# Patient Record
Sex: Female | Born: 1950 | Race: White | Hispanic: No | Marital: Married | Smoking: Never smoker
Health system: Southern US, Community
[De-identification: ages and names within clinical notes are randomized; demographics above are authoritative.]

---

## 2010-08-06 DIAGNOSIS — Z90710 Acquired absence of both cervix and uterus: Secondary | ICD-10-CM | POA: Insufficient documentation

## 2011-06-30 DIAGNOSIS — M1712 Unilateral primary osteoarthritis, left knee: Secondary | ICD-10-CM | POA: Insufficient documentation

## 2014-03-03 DIAGNOSIS — C50911 Malignant neoplasm of unspecified site of right female breast: Secondary | ICD-10-CM | POA: Insufficient documentation

## 2017-10-29 DIAGNOSIS — M545 Low back pain, unspecified: Secondary | ICD-10-CM | POA: Insufficient documentation

## 2019-01-26 DIAGNOSIS — G629 Polyneuropathy, unspecified: Secondary | ICD-10-CM | POA: Insufficient documentation

## 2019-01-26 DIAGNOSIS — K760 Fatty (change of) liver, not elsewhere classified: Secondary | ICD-10-CM | POA: Insufficient documentation

## 2019-01-26 DIAGNOSIS — R7303 Prediabetes: Secondary | ICD-10-CM | POA: Insufficient documentation

## 2019-01-26 DIAGNOSIS — E559 Vitamin D deficiency, unspecified: Secondary | ICD-10-CM | POA: Insufficient documentation

## 2019-08-10 ENCOUNTER — Other Ambulatory Visit: Payer: Self-pay

## 2019-08-10 ENCOUNTER — Ambulatory Visit (INDEPENDENT_AMBULATORY_CARE_PROVIDER_SITE_OTHER): Payer: Medicare Other | Admitting: Family Medicine

## 2019-08-10 ENCOUNTER — Encounter: Payer: Self-pay | Admitting: Family Medicine

## 2019-08-10 ENCOUNTER — Ambulatory Visit (INDEPENDENT_AMBULATORY_CARE_PROVIDER_SITE_OTHER): Payer: Medicare Other

## 2019-08-10 VITALS — BP 158/65 | HR 87 | Temp 97.7°F | Ht 64.5 in | Wt 299.0 lb

## 2019-08-10 DIAGNOSIS — M7520 Bicipital tendinitis, unspecified shoulder: Secondary | ICD-10-CM | POA: Insufficient documentation

## 2019-08-10 DIAGNOSIS — E7800 Pure hypercholesterolemia, unspecified: Secondary | ICD-10-CM | POA: Insufficient documentation

## 2019-08-10 DIAGNOSIS — N951 Menopausal and female climacteric states: Secondary | ICD-10-CM | POA: Insufficient documentation

## 2019-08-10 DIAGNOSIS — I10 Essential (primary) hypertension: Secondary | ICD-10-CM | POA: Insufficient documentation

## 2019-08-10 DIAGNOSIS — K644 Residual hemorrhoidal skin tags: Secondary | ICD-10-CM | POA: Insufficient documentation

## 2019-08-10 DIAGNOSIS — L03115 Cellulitis of right lower limb: Secondary | ICD-10-CM | POA: Diagnosis not present

## 2019-08-10 DIAGNOSIS — G4733 Obstructive sleep apnea (adult) (pediatric): Secondary | ICD-10-CM | POA: Insufficient documentation

## 2019-08-10 DIAGNOSIS — K635 Polyp of colon: Secondary | ICD-10-CM | POA: Insufficient documentation

## 2019-08-10 DIAGNOSIS — Z833 Family history of diabetes mellitus: Secondary | ICD-10-CM | POA: Insufficient documentation

## 2019-08-10 DIAGNOSIS — E538 Deficiency of other specified B group vitamins: Secondary | ICD-10-CM | POA: Insufficient documentation

## 2019-08-10 DIAGNOSIS — E669 Obesity, unspecified: Secondary | ICD-10-CM | POA: Insufficient documentation

## 2019-08-10 DIAGNOSIS — M79671 Pain in right foot: Secondary | ICD-10-CM | POA: Diagnosis not present

## 2019-08-10 DIAGNOSIS — M75101 Unspecified rotator cuff tear or rupture of right shoulder, not specified as traumatic: Secondary | ICD-10-CM | POA: Insufficient documentation

## 2019-08-10 MED ORDER — DOXYCYCLINE HYCLATE 100 MG PO TABS: 100.0000 mg | ORAL_TABLET | Freq: Two times a day (BID) | ORAL | 0 refills | Status: AC

## 2019-08-10 NOTE — Progress Notes (Signed)
Acute Office Visit  Subjective:    Patient ID: Misty Crawford, female    DOB: Mar 04, 1951, 69 y.o.   MRN: [id number]  Chief Complaint  Patient presents with  . Foot Injury    HPI Patient is in today for fell about a week ago and twisted her right ankle.  She said the foot sort of went under her and she had a lot of bruising over the distal foot and toes.  She says it was mostly just tender and painful over the top of the foot when she would try to wear her shoe but not painful with walking.  She has a history of neuropathy and is currently on gabapentin and B complex.. Starting yesterday she noticed ti was red, hot and throbbing.  She has been icing it.      History reviewed. No pertinent past medical history.  History reviewed. No pertinent surgical history.  History reviewed. No pertinent family history.  Social History   Socioeconomic History  . Marital status: Married    Spouse name: Not on file  . Number of children: Not on file  . Years of education: Not on file  . Highest education level: Not on file  Occupational History  . Not on file  Tobacco Use  . Smoking status: Never Smoker  . Smokeless tobacco: Never Used  Substance and Sexual Activity  . Alcohol use: Never  . Drug use: Not on file  . Sexual activity: Not Currently    Partners: Male  Other Topics Concern  . Not on file  Social History Narrative  . Not on file   Social Determinants of Health   Financial Resource Strain:   . Difficulty of Paying Living Expenses: Not on file  Food Insecurity:   . Worried About Charity fundraiser in the Last Year: Not on file  . Ran Out of Food in the Last Year: Not on file  Transportation Needs:   . Lack of Transportation (Medical): Not on file  . Lack of Transportation (Non-Medical): Not on file  Physical Activity:   . Days of Exercise per Week: Not on file  . Minutes of Exercise per Session: Not on file  Stress:   . Feeling of Stress : Not on file  Social  Connections:   . Frequency of Communication with Friends and Family: Not on file  . Frequency of Social Gatherings with Friends and Family: Not on file  . Attends Religious Services: Not on file  . Active Member of Clubs or Organizations: Not on file  . Attends Archivist Meetings: Not on file  . Marital Status: Not on file  Intimate Partner Violence:   . Fear of Current or Ex-Partner: Not on file  . Emotionally Abused: Not on file  . Physically Abused: Not on file  . Sexually Abused: Not on file    Outpatient Medications Prior to Visit  Medication Sig Dispense Refill  . amLODipine (NORVASC) 5 MG tablet Take 5 mg by mouth at bedtime.    Marland Kitchen aspirin 81 MG EC tablet Take 1 tablet by mouth at bedtime.    . B Complex-C-Folic Acid (RENAL) 1 MG CAPS Take 1 capsule by mouth at bedtime.    . Cholecalciferol 50 MCG (2000 UT) CAPS Take 1 capsule by mouth at bedtime.    . DULoxetine (CYMBALTA) 30 MG capsule Take 30 mg by mouth at bedtime.    . furosemide (LASIX) 20 MG tablet Take 20 mg by mouth  2 (two) times daily as needed.    . gabapentin (NEURONTIN) 400 MG capsule Take 400 mg by mouth at bedtime.    Marland Kitchen levocetirizine (XYZAL) 5 MG tablet Take 5 mg by mouth at bedtime.    Marland Kitchen losartan (COZAAR) 100 MG tablet Take 100 mg by mouth daily.    Marland Kitchen lovastatin (MEVACOR) 40 MG tablet Take 40 mg by mouth at bedtime.    . tamoxifen (NOLVADEX) 20 MG tablet Take 20 mg by mouth daily.    . TURMERIC PO Take 1,400 mcg by mouth at bedtime.     No facility-administered medications prior to visit.    Allergies  Allergen Reactions  . Penicillins Anaphylaxis    Age 69: states mother told her she was hospitalized after passing out"    Review of Systems     Objective:    Physical Exam Vitals reviewed.  Constitutional:      Appearance: She is well-developed.  HENT:     Head: Normocephalic and atraumatic.  Eyes:     Conjunctiva/sclera: Conjunctivae normal.  Cardiovascular:     Rate and Rhythm:  Normal rate.  Pulmonary:     Effort: Pulmonary effort is normal.  Musculoskeletal:     Comments: Right foot with 2+ pitting edema in both ankles and feet.  She has a little bit of dried blood at the base of the nail at her left great toenail but no erythema or swelling or active drainage.  On her right foot she has significant erythema over most of the top of the foot it does not extend down to the toes or past the ankle.  She also has significant bruising over the distal metatarsal heads on the top of the foot between the second third fourth and fifth metatarsals.  She is very tender over the proximal and distal end of the fifth metatarsal.  She is able to flex and extend her toes but they are quite stiff and swollen. Right ankle with NROM and no increased laxity  Skin:    General: Skin is dry.     Coloration: Skin is not pale.  Neurological:     Mental Status: She is alert and oriented to person, place, and time.  Psychiatric:        Behavior: Behavior normal.     BP (!) 158/65   Pulse 87   Temp 97.7 F (36.5 C)   Ht 5' 4.5" (1.638 m)   Wt 299 lb (135.6 kg)   BMI 50.53 kg/m  Wt Readings from Last 3 Encounters:  08/10/19 299 lb (135.6 kg)    Health Maintenance Due  Topic Date Due  . Hepatitis C Screening  03/08/1951  . MAMMOGRAM  07/25/1999  . COLONOSCOPY  07/25/1999  . DEXA SCAN  07/24/2014  . TETANUS/TDAP  12/01/2017    There are no preventive care reminders to display for this patient.   No results found for: TSH No results found for: WBC, HGB, HCT, MCV, PLT No results found for: NA, K, CHLORIDE, CO2, GLUCOSE, BUN, CREATININE, BILITOT, ALKPHOS, AST, ALT, PROT, ALBUMIN, CALCIUM, ANIONGAP, EGFR, GFR No results found for: CHOL No results found for: HDL No results found for: LDLCALC No results found for: TRIG No results found for: CHOLHDL No results found for: HGBA1C     Assessment & Plan:   Problem List Items Addressed This Visit    None    Visit Diagnoses     Right foot pain    -  Primary  Relevant Orders   DG Foot Complete Right (Completed)   Cellulitis of right lower extremity       Relevant Medications   doxycycline (VIBRA-TABS) 100 MG tablet     Right foot pain -she has significant tenderness over the fifth metatarsal we will get x-ray just to rule out fracture.  If positive consider a postop shoe versus a cam walker.  She is had a fracture on her opposite foot before in the toes after an injury and said she had really significant difficulty with CAM Walker.  Okay to use Tylenol as needed for pain control.  Cellulitis -ahead and treat with oral doxycycline.  Call if not improving over the next few days.  Did warn that sometimes the redness can spread just a little bit in the first 24 hours after antibiotics but then it should start to proceed and if its not she needs let us know.  Recommend elevation and compression if possible.  Okay to take an extra dose of her furosemide if needed for swelling.   Meds ordered this encounter  Medications  . doxycycline (VIBRA-TABS) 100 MG tablet    Sig: Take 1 tablet (100 mg total) by mouth 2 (two) times daily.    Dispense:  20 tablet    Refill:  0     Beatrice Lecher, MD

## 2019-08-10 NOTE — Patient Instructions (Signed)
Try to keep your feet elevated.  If you have compression stockings that can help with some of the swelling. K to take an extra dose of Lasix if needed.

## 2021-06-07 IMAGING — DX DG FOOT COMPLETE 3+V*R*
4 series · 4 of 4 positions shown · non-contrast
Comparison: None.

CLINICAL DATA: Right foot pain, swelling and bruising after fall
last week rolling foot.

EXAM:
RIGHT FOOT COMPLETE - 3+ VIEW

[foot ap (1 of 2)]
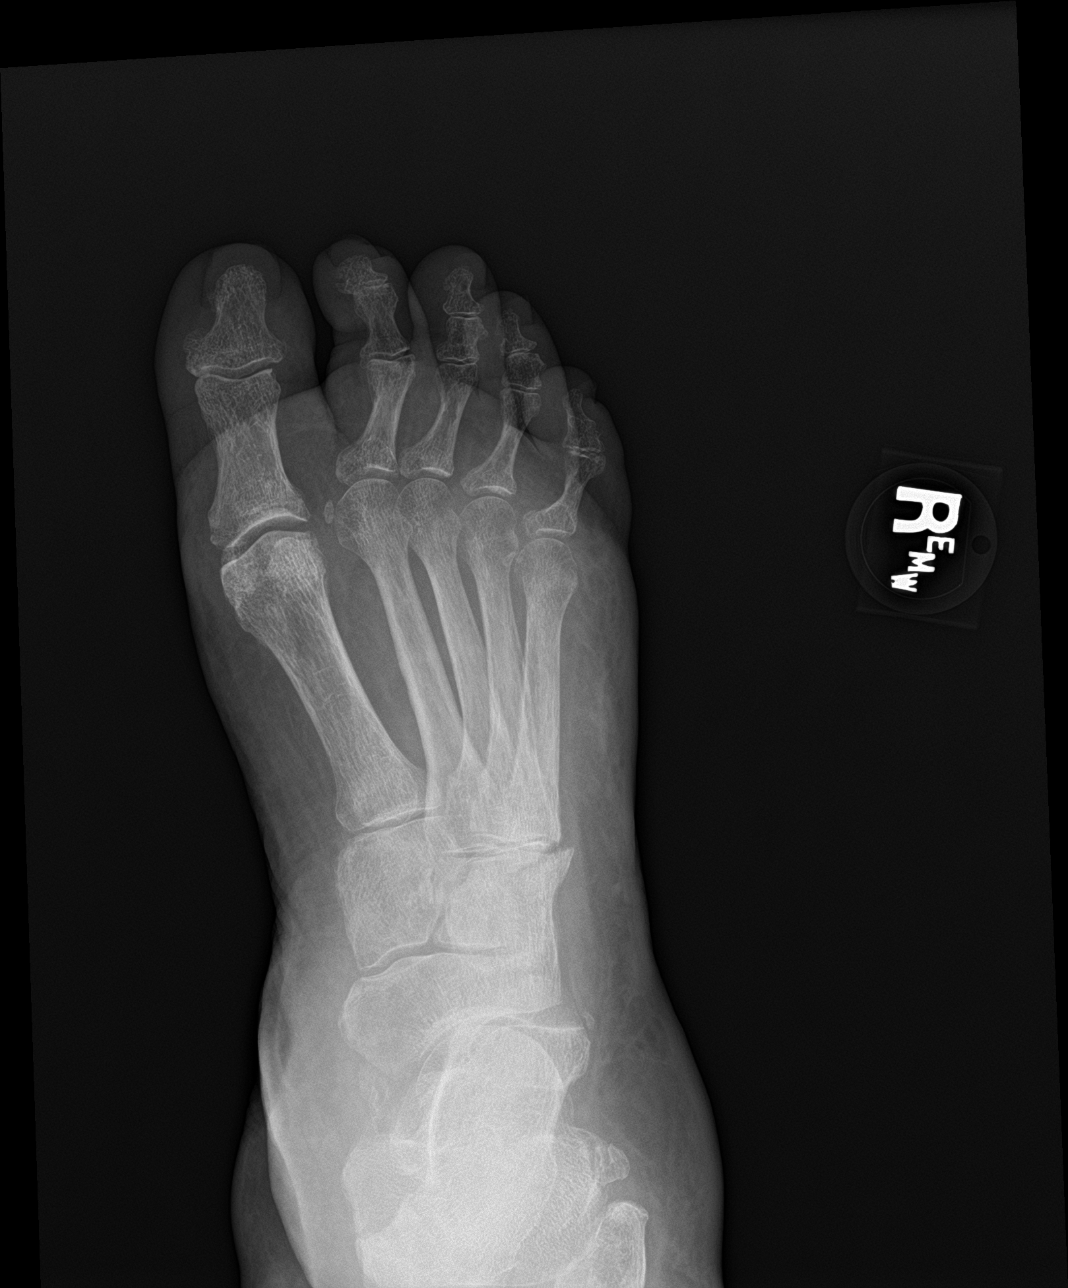

[foot obl]
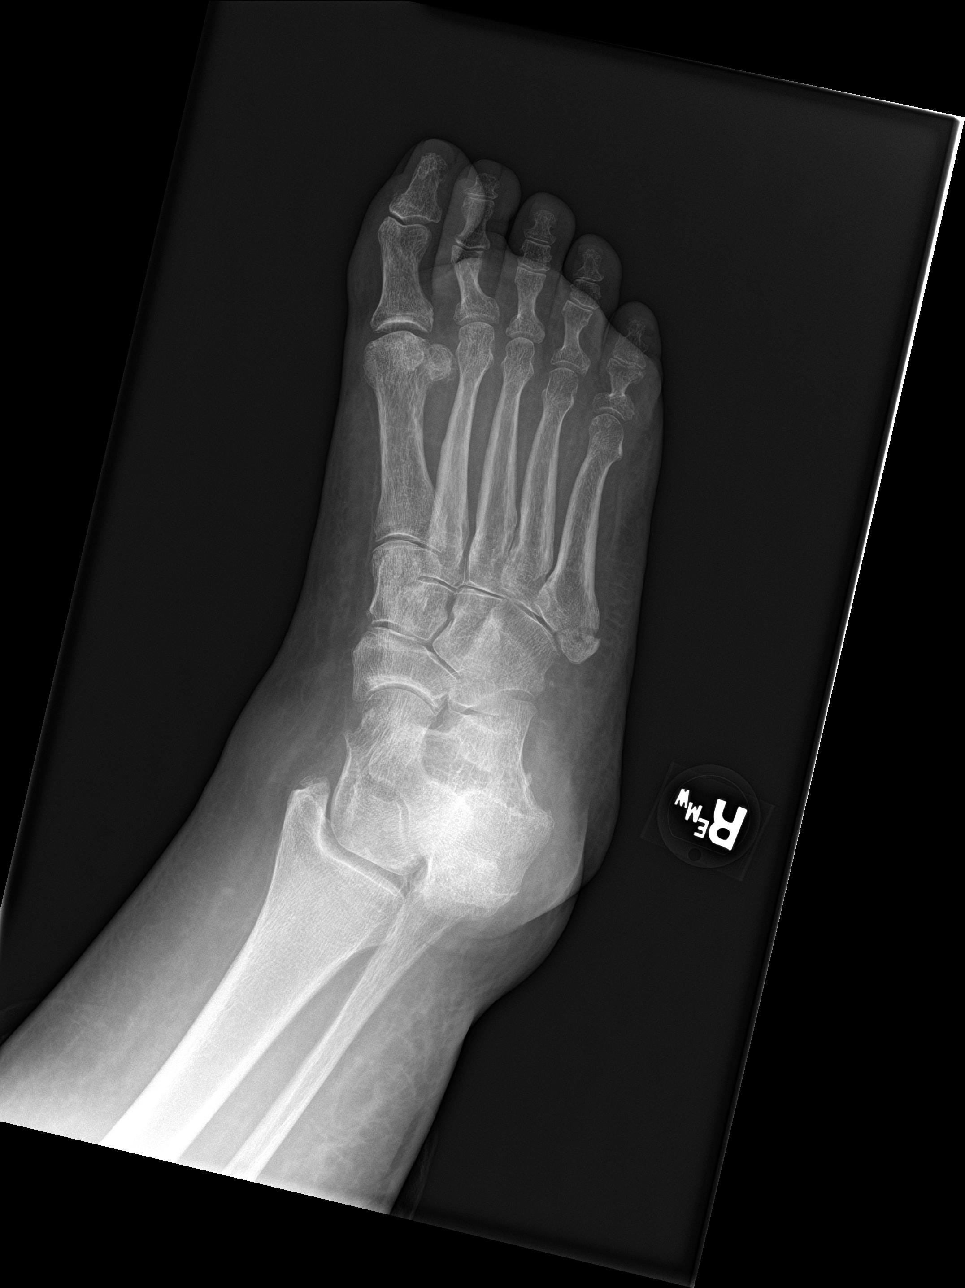

[foot lat]
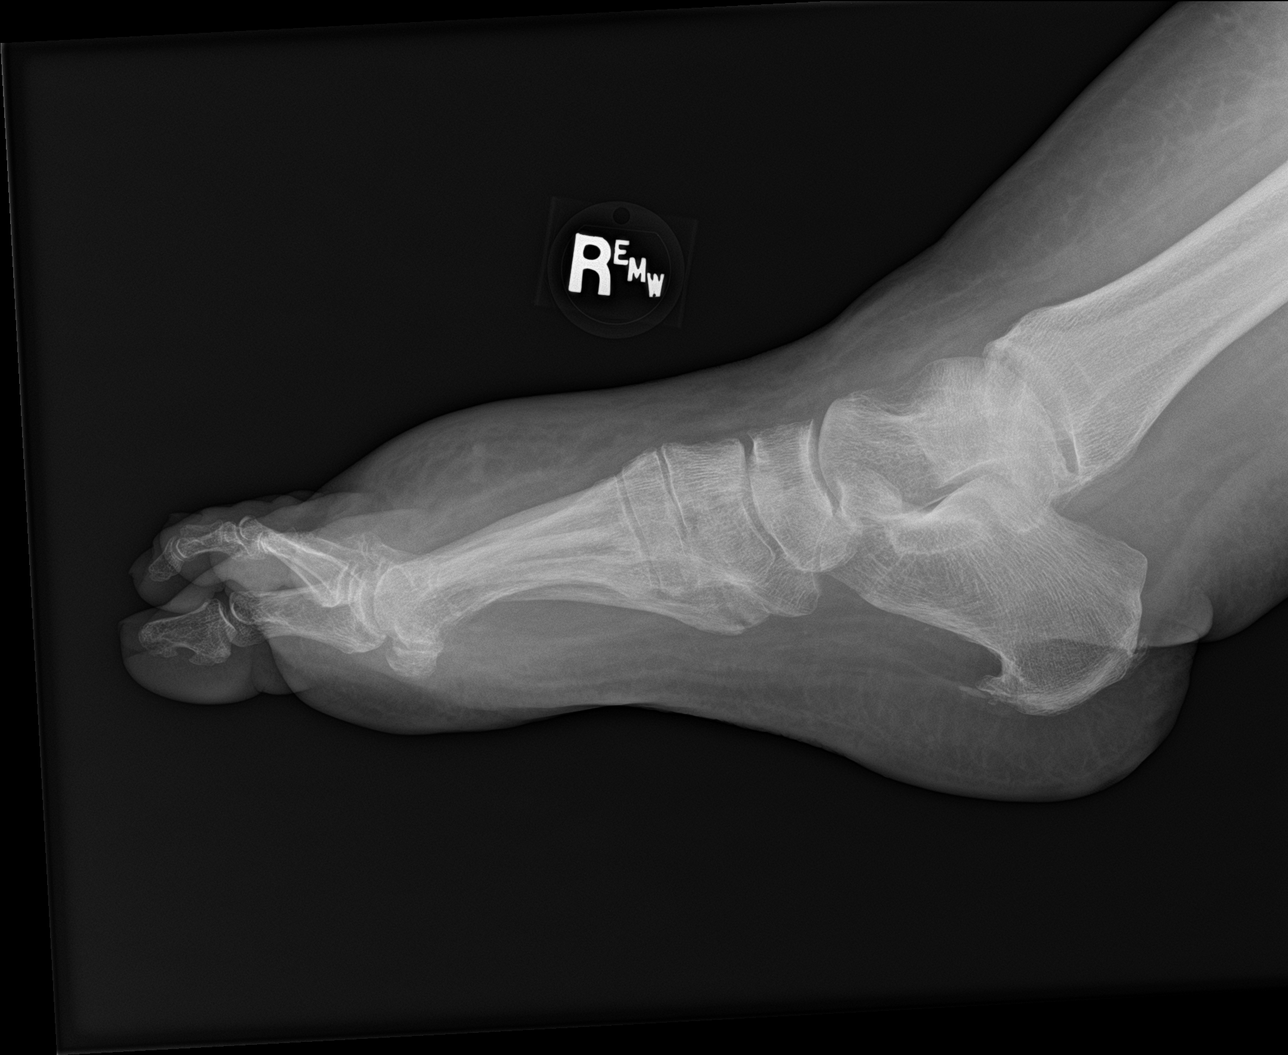

[foot ap (2 of 2)]
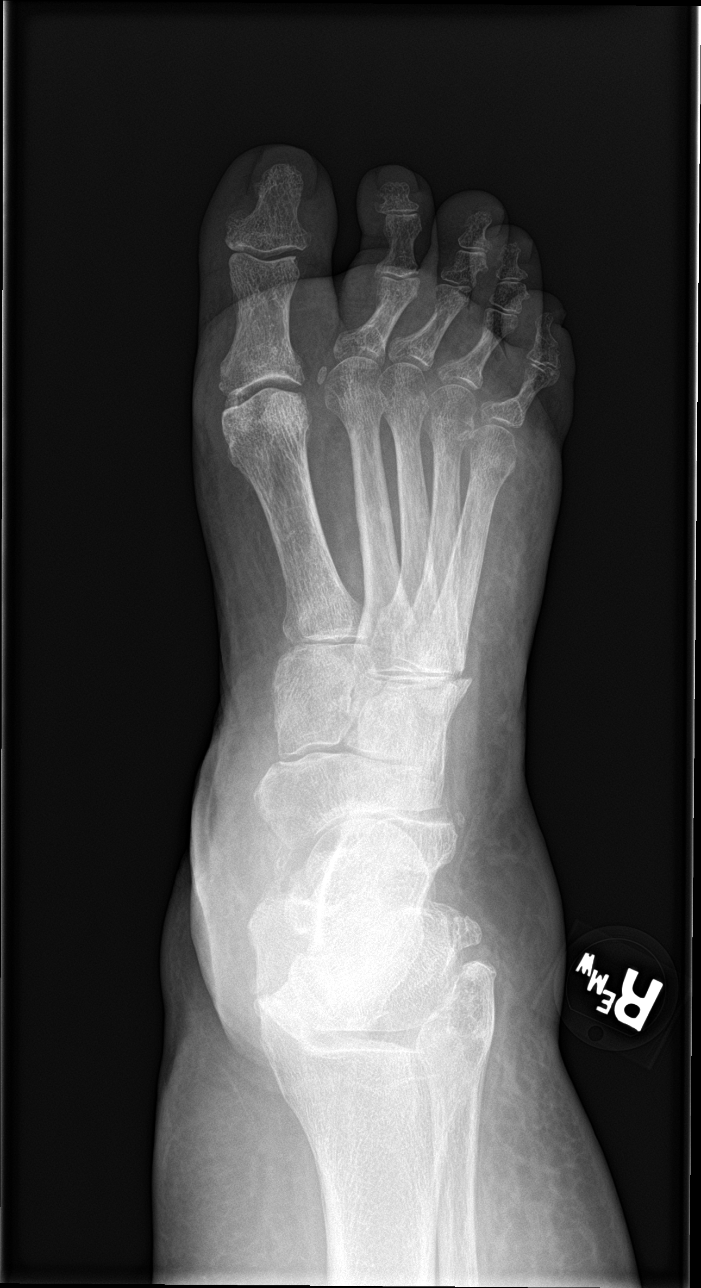

[4 of 4 positions shown; findings below may reference images not displayed]

FINDINGS: Acute displaced oblique fracture at the base of the fifth
metatarsal. Fracture extends to the tarsal metatarsal joint. No
additional fracture of the foot. Mild degenerative change in the
hindfoot. There is a prominent plantar calcaneal spur. Mild
hammertoe deformity of the digits. Soft tissue edema is most
prominent dorsal and laterally.
IMPRESSION: Acute displaced fracture at the base of the fifth metatarsal.
Associated soft tissue edema.
